# Patient Record
Sex: Male | Born: 1965 | Hispanic: Yes | Marital: Married | Smoking: Never smoker
Health system: Southern US, Community
[De-identification: ages and names within clinical notes are randomized; demographics above are authoritative.]

## PROBLEM LIST (undated history)

## (undated) DIAGNOSIS — R161 Splenomegaly, not elsewhere classified: Secondary | ICD-10-CM

## (undated) DIAGNOSIS — D696 Thrombocytopenia, unspecified: Secondary | ICD-10-CM

## (undated) HISTORY — DX: Splenomegaly, not elsewhere classified: R16.1

## (undated) HISTORY — DX: Thrombocytopenia, unspecified: D69.6

---

## 2009-04-16 ENCOUNTER — Emergency Department: Payer: Self-pay | Admitting: Emergency Medicine

## 2010-01-31 ENCOUNTER — Ambulatory Visit: Payer: Self-pay | Admitting: Gastroenterology

## 2010-03-22 ENCOUNTER — Ambulatory Visit: Payer: Self-pay | Admitting: Gastroenterology

## 2013-05-10 ENCOUNTER — Emergency Department: Payer: Self-pay | Admitting: Emergency Medicine

## 2018-10-01 ENCOUNTER — Ambulatory Visit: Payer: Self-pay

## 2018-10-01 ENCOUNTER — Other Ambulatory Visit: Payer: Self-pay

## 2018-10-01 VITALS — BP 140/96 | HR 69 | Temp 97.1°F | Resp 15 | Ht 64.0 in | Wt 187.0 lb

## 2018-10-01 DIAGNOSIS — Z23 Encounter for immunization: Secondary | ICD-10-CM

## 2018-10-01 DIAGNOSIS — Z008 Encounter for other general examination: Secondary | ICD-10-CM

## 2018-10-01 LAB — POCT LIPID PANEL
Glucose Fasting, POC: 124 mg/dL — AB (ref 70–99)
HDL: 36
LDL: 87
Non-HDL: 128
TC/HDL: 4.6
TC: 164
TRG: 206

## 2018-10-01 NOTE — Patient Instructions (Signed)
Prevencin de la exposicin a lquidos corporales Preventing Body Fluid Exposure La sangre o los lquidos corporales, como la [place], las Tooeleheces, el semen, las secreciones vaginales, la saliva o la leche materna pueden contener grmenes (bacterias o virus) que pueden causar infecciones. La mejor manera de evitar la infeccin por estos grmenes es prevenir la exposicin a los lquidos corporales. Cmo puede afectarme la exposicin a los lquidos corporales? Los grmenes se pueden propagar cuando los lquidos del cuerpo de una persona infectada entran en contacto con la boca, la nariz, los ojos, los genitales o las lesiones de la piel de Engineer, maintenance (IT)otra persona. Qu puede aumentar el riesgo? Usted tiene una mayor probabilidad de quedar expuesto a lquidos corporales infectados si:  Es un trabajador sanitario o un miembro de la familia que est cuidando a una persona enferma.  [place] agujas para inyectarse drogas y comparte agujas con otros consumidores.  Tiene relaciones sexuales o participa en otras actividades sexuales sin usar un condn u otra proteccin. Qu medidas puedo tomar para prevenir la exposicin a los lquidos corporales?   Lave y desinfecte las mesadas y otras superficies regularmente.  Use un equipo protector adecuado, como guantes, batas, mscaras o antiparras cuando haya riesgo de exposicin.  Limpie todos los restos de lquidos corporales con toallas desechables y limpie la zona con un desinfectante.  Deseche correctamente todos los derivados de la sangre y otros lquidos. Use bolsas de seguridad.  Evite volver a tapar las agujas.  Deseche adecuadamente las agujas y otros instrumentos que tengan puntas o filos (objetos afilados). Use recipientes cerrados que estn marcados para objetos punzantes.  Evite el uso de drogas inyectables.  No comparta agujas.  Use preservativos durante las relaciones sexuales.  Use pequeas lminas de plstico (diques dentales) para cubrir la boca, la  vagina o el ano a fin de reducir el riesgo de contraer el VIH u otras infecciones de transmisin sexual durante el sexo oral.  Conozca y siga todas las pautas para prevenir la exposicin (precauciones universales) proporcionadas en su lugar de [place]rabajo. Qu medidas puedo tomar para reducir mis probabilidades de contraer una infeccin?   Lvese las manos frecuentemente con agua y [place]. Use desinfectante para manos si no dispone de [place] y [place].  Asegrese de que sus vacunas estn actualizadas, incluyendo las vacunas contra el ttanos y la hepatitis.  Evite tener mltiples parejas sexuales.  Considere la profilaxis de preexposicin (pre-exposure prophylaxis, PrEP) para el VIH si: ? Tiene una relacin continua con Neomia Dearuna pareja sexual que es VIH positivo. ? Tiene mltiples parejas sexuales y participa en relaciones sexuales sin proteccin. ? Tiene relaciones sexuales con parejas sexuales de alto riesgo.  Considere la profilaxis postexposicin para el VIH con medicamentos (antirretrovirales) despus de Child psychotherapisttener relaciones sexuales sin proteccin. Para que sea ms eficaz, esto se debe comenzar dentro de las 72 horas despus de una posible exposicin. Qu medidas puedo tomar para evitar la propagacin de la infeccin a Economistotras personas?  Mantenga las heridas abiertas cubiertas.  Deseche todos los elementos que contengan sangre colocndolos en la basura. Esto incluye afeitadoras, tampones y vendajes.  No comparta elementos de higiene personal como cepillos de dientes, afeitadoras o hilo dental.  No comparta elementos relacionados con las drogas con Economistotras personas. Estos incluyen agujas, jeringas, sorbetes y pipas.  Siga todas las indicaciones de su mdico para prevenir el contagio de la infeccin. Dnde buscar ms informacin [employer] Millsational Institute for Forensic scientistccupational Safety and Health Biomedical scientist(NIOSH) (Instituto [place]Nacional de Seguridad y Medical LakeSalud Ocupacional): LegalWarrants.glwww.cdc.gov/niosh Resumen  La  mejor manera de evitar la  infeccin por estos grmenes propagada a travs de la sangre o lquidos corporales es prevenir la exposicin a los lquidos corporales.  Use un equipo protector adecuado, como guantes, batas, mscaras o antiparras cuando haya riesgo de exposicin.  Lvese las manos frecuentemente con agua y [place]. Use desinfectante para manos si no dispone de [place] y [place].  Siga todas las indicaciones de su mdico para prevenir el contagio de la infeccin. Esta informacin no tiene Theme park manager el consejo del mdico. Asegrese de hacerle al mdico cualquier pregunta que tenga. Document Released: 03/04/2018 Document Revised: 03/04/2018 Document Reviewed: 03/04/2018 Elsevier Patient Education  2020 Elsevier Inc. [place] antigripal (inactivada o recombinante): lo que debe saber Influenza (Flu) Vaccine (Inactivated or Recombinant): What You Need to Know 1. Por qu vacunarse? La vacuna contra la gripe puede prevenir la gripe. La gripe es una enfermedad contagiosa que se disemina en los [place] cada ao, por lo general, Eusebio Me octubre y [place]. Cualquier persona puede contraer gripe, pero es ms peligrosa para Runner, broadcasting/film/video. Los bebs y los nios pequeos, los [employer] de 65aos, las [place], as [employer] personas que tienen ciertas enfermedades o cuyo sistema inmunitario est debilitado corren un riesgo mayor de tener complicaciones debido a la gripe. La neumona, la bronquitis, las infecciones de los senos paranasales y las infecciones de odos son ejemplos de complicaciones relacionadas con la gripe. Si tiene una afeccin, por ejemplo, enfermedad cardaca, cncer o diabetes, la gripe puede empeorarla. La gripe puede causar fiebre y escalofros, dolor de garganta, dolores musculares, fatiga, tos, dolor de [place] y secrecin o congestin nasal. Algunas personas pueden tener vmitos y Barnett Hatter, [place] esto es ms frecuente en los nios que en los adultos. Cada ao, miles de [employer] Estados  Unidos debido a la gripe, y muchas ms deben ser hospitalizadas. Cada ao, la vacuna antigripal previene millones de enfermedades y evita visitas al mdico relacionadas con la gripe. 2. Madilyn Fireman contra la gripe Los CDC (Centros para el Control y la Prevencin de [place]) recomiendan que todas las personas a Glass blower/designer de los 6 meses de edad se vacunen cada temporada de gripe. Es posible que los nios de a 8aos deban recibir 2 dosis durante la misma temporada de gripe. Todas las dems personas tienen que aplicarse 1 sola dosis cada temporada de gripe. La vacuna comienza a surtir Librarian, academic 2semanas despus de su aplicacin. Hay muchos virus de la gripe, y Estate agent. Cada ao, se elabora una nueva vacuna antigripal para brindar proteccin contra tres o cuatro virus que probablemente causen la enfermedad en la siguiente temporada de gripe. Incluso si la vacuna no es especfica para esos virus, aun as puede brindar cierta proteccin. La vacuna contra la gripe no causa gripe. La vacuna contra la gripe puede administrarse al mismo tiempo que otras vacunas. 3. Hable con el mdico Comunquese con la persona que le coloca las vacunas si la persona que la recibe:  Ha tenido una reaccin alrgica despus de Neomia Dear dosis previa de la vacuna contra la gripe o tiene alguna alergia grave, potencialmente mortal.  Alguna vez tuvo sndrome de Guillain-Barr (tambin llamado SGB). En algunos casos, es posible que el mdico decida posponer la aplicacin de la vacuna contra la gripe para una visita en el futuro. Las personas que sufren trastornos menores, como un resfro, pueden vacunarse. Las personas que tienen enfermedades moderadas o graves generalmente deben esperar hasta recuperarse para poder recibir la vacuna contra la  gripe. Su mdico puede darle ms informacin. 4. Riesgos de [place] reaccin a la vacuna  Despus de recibir la vacuna contra la gripe, Automotive engineer,  enrojecimiento e Estate agent de la inyeccin, fiebre, dolores musculares y Social research officer, government de [place].  Puede haber un pequeo aumento del riesgo de sufrir sndrome de Curator (SGB) despus de la aplicacin de la vacuna contra la gripe inactivada. Los nios pequeos que reciben la vacuna antigripal junto con la vacuna antineumoccica (PCV13), o la DTaP en el mismo momento pueden tener una probabilidad un poco ms elevada de tener una convulsin debido a la fiebre. Informe al mdico si un nio que est recibiendo la vacuna antigripal ha tenido una convulsin alguna vez. Las personas a veces se desmayan despus de procedimientos mdicos, incluida la vacunacin. Informe al mdico si se siente mareado, tiene cambios en la visin o zumbidos en los odos. Al igual que con cualquier [employer], existe una probabilidad muy remota de que una vacuna cause una reaccin alrgica grave, otra lesin grave o la muerte. 5. Qu pasa si se presenta un problema grave? Podra producirse una reaccin alrgica despus de que la persona vacunada abandone la clnica. Si observa signos de Nurse, mental health grave (ronchas, hinchazn de la cara y la garganta, dificultad para respirar, latidos cardacos acelerados, mareos o debilidad), llame al 9-1-1 y lleve a la persona al hospital ms cercano. Si se presentan otros signos que le preocupan, comunquese con su mdico. Las reacciones adversas deben informarse al Sistema de Informe de Eventos Adversos de Clinical biochemist (Vaccine Adverse Event Reporting System, VAERS). Por lo general, el mdico presenta este informe o puede hacerlo usted mismo. Visite el sitio web del VAERS en [email or web address] o llame al [phone].El VAERS es solo para Electrical engineer; su personal no proporciona asesoramiento mdico. 6. Programa Nacional de Compensacin de Daos por Sanderson de Compensacin de Daos por Clinical biochemist (National Vaccine Injury Fiserv, Runner, broadcasting/film/video) es un  programa federal que fue creado para Patent examiner a las personas que puedan haber sufrido daos al recibir ciertas vacunas. Visite el sitio web del VICP en [email or web address] o llame al 1-316-590-6093 para obtener ms informacin acerca del programa y de cmo presentar un reclamo. Hay un lmite de tiempo para presentar un reclamo de compensacin. 7. Cmo puedo obtener ms informacin?  Pregntele al mdico.  Comunquese con el servicio de salud de su localidad o su estado.  Comunquese con los Centros para el Control y la Prevencin de Probation officer for Disease Control and Prevention, CDC): ? Llame al [phone] (1-800-CDC-INFO) o ? Visite el sitio Biomedical engineer en https://gibson.com/ Declaracin de informacin (provisional) sobre la vacuna contra la gripe inactivada (15/08/2017) Esta informacin no tiene Marine scientist el consejo del mdico. Asegrese de hacerle al mdico cualquier pregunta que tenga. Document Released: 03/22/2008 Document Revised: 09/02/2017 Document Reviewed: 09/02/2017 Elsevier Patient Education  2020 [employer].

## 2018-10-01 NOTE — Progress Notes (Signed)
     Patient ID: Javier Rice, male    DOB: [birth year], 53 y.o.   MRN: [id number]    Thank you!!  Apolonio Schneiders RN  [facility] Nurse Specialist [place]: [phone]  Cell:  [phone] Website: [email or web address]

## 2019-03-23 ENCOUNTER — Other Ambulatory Visit: Payer: Self-pay

## 2019-03-23 ENCOUNTER — Emergency Department: Payer: BC Managed Care – PPO

## 2019-03-23 ENCOUNTER — Emergency Department
Admission: EM | Admit: 2019-03-23 | Discharge: 2019-03-23 | Disposition: A | Discharge status: Home / Self Care | Payer: BC Managed Care – PPO | Attending: Emergency Medicine | Admitting: Emergency Medicine

## 2019-03-23 DIAGNOSIS — M25512 Pain in left shoulder: Secondary | ICD-10-CM | POA: Insufficient documentation

## 2019-03-23 DIAGNOSIS — Y9389 Activity, other specified: Secondary | ICD-10-CM | POA: Insufficient documentation

## 2019-03-23 DIAGNOSIS — M899 Disorder of bone, unspecified: Secondary | ICD-10-CM | POA: Diagnosis not present

## 2019-03-23 DIAGNOSIS — D7389 Other diseases of spleen: Secondary | ICD-10-CM | POA: Insufficient documentation

## 2019-03-23 DIAGNOSIS — S0990XA Unspecified injury of head, initial encounter: Secondary | ICD-10-CM | POA: Diagnosis present

## 2019-03-23 DIAGNOSIS — M542 Cervicalgia: Secondary | ICD-10-CM | POA: Diagnosis not present

## 2019-03-23 DIAGNOSIS — Y998 Other external cause status: Secondary | ICD-10-CM | POA: Diagnosis not present

## 2019-03-23 DIAGNOSIS — Y92410 Unspecified street and highway as the place of occurrence of the external cause: Secondary | ICD-10-CM | POA: Diagnosis not present

## 2019-03-23 LAB — COMPREHENSIVE METABOLIC PANEL WITH GFR
ALT: 24 U/L (ref 0–44)
AST: 27 U/L (ref 15–41)
Albumin: 4.4 g/dL (ref 3.5–5.0)
Alkaline Phosphatase: 90 U/L (ref 38–126)
Anion gap: 10 (ref 5–15)
BUN: 18 mg/dL (ref 6–20)
CO2: 23 mmol/L (ref 22–32)
Calcium: 9 mg/dL (ref 8.9–10.3)
Chloride: 107 mmol/L (ref 98–111)
Creatinine, Ser: 0.73 mg/dL (ref 0.61–1.24)
GFR calc Af Amer: >60 mL/min
GFR calc non Af Amer: >60 mL/min
Glucose, Bld: 146 mg/dL — ABNORMAL HIGH (ref 70–99)
Potassium: 3.8 mmol/L (ref 3.5–5.1)
Sodium: 140 mmol/L (ref 135–145)
Total Bilirubin: 1 mg/dL (ref 0.3–1.2)
Total Protein: 7.4 g/dL (ref 6.5–8.1)

## 2019-03-23 LAB — CBC WITH DIFFERENTIAL/PLATELET
Abs Immature Granulocytes: 0.03 10*3/uL (ref 0.00–0.07)
Basophils Absolute: 0 10*3/uL (ref 0.0–0.1)
Basophils Relative: 0 %
Eosinophils Absolute: 0.3 10*3/uL (ref 0.0–0.5)
Eosinophils Relative: 3 %
HCT: 43.8 % (ref 39.0–52.0)
Hemoglobin: 15.8 g/dL (ref 13.0–17.0)
Immature Granulocytes: 0 %
Lymphocytes Relative: 22 %
Lymphs Abs: 2 10*3/uL (ref 0.7–4.0)
MCH: 30.4 pg (ref 26.0–34.0)
MCHC: 36.1 g/dL — ABNORMAL HIGH (ref 30.0–36.0)
MCV: 84.2 fL (ref 80.0–100.0)
Monocytes Absolute: 0.7 10*3/uL (ref 0.1–1.0)
Monocytes Relative: 7 %
Neutro Abs: 6.1 10*3/uL (ref 1.7–7.7)
Neutrophils Relative %: 68 %
Platelets: 149 10*3/uL — ABNORMAL LOW (ref 150–400)
RBC: 5.2 MIL/uL (ref 4.22–5.81)
RDW: 13.3 % (ref 11.5–15.5)
WBC: 9.1 10*3/uL (ref 4.0–10.5)
nRBC: 0 % (ref 0.0–0.2)

## 2019-03-23 MED ORDER — IBUPROFEN 600 MG PO TABS: 600.0000 mg | ORAL_TABLET | Freq: Once | ORAL | Status: DC

## 2019-03-23 MED ORDER — ACETAMINOPHEN 325 MG PO TABS
650.0000 mg | ORAL_TABLET | Freq: Once | ORAL | Status: AC
  Administered 2019-03-23: 650 mg via ORAL
  Filled 2019-03-23: qty 2

## 2019-03-23 MED ORDER — IOHEXOL 300 MG/ML  SOLN
100.0000 mL | Freq: Once | INTRAMUSCULAR | Status: AC | PRN
  Administered 2019-03-23: 100 mL via INTRAVENOUS
  Filled 2019-03-23: qty 100
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
## 2019-03-23 NOTE — Discharge Instructions (Signed)
There are some concerning masses on your spine and in your spleen on your CT scans.  Please follow-up with Dr. Cathie Hoops with oncology this week.  You can call her office tomorrow if you have not heard from them tomorrow morning.  Please stay out of work this week until you have a follow-up appointment with her.

## 2019-03-25 ENCOUNTER — Encounter: Payer: Self-pay | Admitting: Oncology

## 2019-03-25 ENCOUNTER — Inpatient Hospital Stay: Payer: BC Managed Care – PPO | Attending: Oncology | Admitting: Oncology

## 2019-03-25 ENCOUNTER — Other Ambulatory Visit: Payer: Self-pay

## 2019-03-25 ENCOUNTER — Inpatient Hospital Stay: Payer: BC Managed Care – PPO

## 2019-03-25 VITALS — BP 119/83 | HR 62 | Temp 97.4°F | Resp 18 | Wt 195.5 lb

## 2019-03-25 DIAGNOSIS — D7389 Other diseases of spleen: Secondary | ICD-10-CM

## 2019-03-25 DIAGNOSIS — M899 Disorder of bone, unspecified: Secondary | ICD-10-CM | POA: Diagnosis present

## 2019-03-25 DIAGNOSIS — S0990XA Unspecified injury of head, initial encounter: Secondary | ICD-10-CM | POA: Diagnosis not present

## 2019-03-25 DIAGNOSIS — M954 Acquired deformity of chest and rib: Secondary | ICD-10-CM | POA: Diagnosis not present

## 2019-03-25 DIAGNOSIS — R0781 Pleurodynia: Secondary | ICD-10-CM | POA: Diagnosis not present

## 2019-03-25 DIAGNOSIS — R519 Headache, unspecified: Secondary | ICD-10-CM

## 2019-03-25 DIAGNOSIS — F419 Anxiety disorder, unspecified: Secondary | ICD-10-CM | POA: Diagnosis not present

## 2019-03-25 DIAGNOSIS — F329 Major depressive disorder, single episode, unspecified: Secondary | ICD-10-CM | POA: Insufficient documentation

## 2019-03-25 DIAGNOSIS — M542 Cervicalgia: Secondary | ICD-10-CM | POA: Insufficient documentation

## 2019-03-25 DIAGNOSIS — I898 Other specified noninfective disorders of lymphatic vessels and lymph nodes: Secondary | ICD-10-CM | POA: Insufficient documentation

## 2019-03-25 DIAGNOSIS — Y92410 Unspecified street and highway as the place of occurrence of the external cause: Secondary | ICD-10-CM | POA: Diagnosis not present

## 2019-03-25 LAB — CBC WITH DIFFERENTIAL/PLATELET
Abs Immature Granulocytes: 0.02 10*3/uL (ref 0.00–0.07)
Basophils Absolute: 0 10*3/uL (ref 0.0–0.1)
Basophils Relative: 0 %
Eosinophils Absolute: 0.1 10*3/uL (ref 0.0–0.5)
Eosinophils Relative: 1 %
HCT: 42 % (ref 39.0–52.0)
Hemoglobin: 14.8 g/dL (ref 13.0–17.0)
Immature Granulocytes: 0 %
Lymphocytes Relative: 17 %
Lymphs Abs: 1.4 10*3/uL (ref 0.7–4.0)
MCH: 29.8 pg (ref 26.0–34.0)
MCHC: 35.2 g/dL (ref 30.0–36.0)
MCV: 84.7 fL (ref 80.0–100.0)
Monocytes Absolute: 0.5 10*3/uL (ref 0.1–1.0)
Monocytes Relative: 6 %
Neutro Abs: 5.9 10*3/uL (ref 1.7–7.7)
Neutrophils Relative %: 76 %
Platelets: 127 10*3/uL — ABNORMAL LOW (ref 150–400)
RBC: 4.96 MIL/uL (ref 4.22–5.81)
RDW: 13.5 % (ref 11.5–15.5)
WBC: 7.9 10*3/uL (ref 4.0–10.5)
nRBC: 0 % (ref 0.0–0.2)

## 2019-03-25 LAB — HEPATITIS PANEL, ACUTE
HCV Ab: NONREACTIVE
Hep A IgM: NONREACTIVE
Hep B C IgM: NONREACTIVE
Hepatitis B Surface Ag: NONREACTIVE

## 2019-03-25 LAB — HIV ANTIBODY (ROUTINE TESTING W REFLEX): HIV Screen 4th Generation wRfx: NONREACTIVE

## 2019-03-25 LAB — LACTATE DEHYDROGENASE: LDH: 131 U/L (ref 98–192)

## 2019-03-25 LAB — TECHNOLOGIST SMEAR REVIEW

## 2019-03-26 LAB — KAPPA/LAMBDA LIGHT CHAINS
Kappa free light chain: 13.8 mg/L (ref 3.3–19.4)
Kappa, lambda light chain ratio: 0.87 (ref 0.26–1.65)
Lambda free light chains: 15.9 mg/L (ref 5.7–26.3)
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
## 2019-03-29 LAB — MULTIPLE MYELOMA PANEL, SERUM
Albumin SerPl Elph-Mcnc: 3.7 g/dL (ref 2.9–4.4)
Albumin/Glob SerPl: 1.3 (ref 0.7–1.7)
Alpha 1: 0.2 g/dL (ref 0.0–0.4)
Alpha2 Glob SerPl Elph-Mcnc: 0.6 g/dL (ref 0.4–1.0)
B-Globulin SerPl Elph-Mcnc: 1.2 g/dL (ref 0.7–1.3)
Gamma Glob SerPl Elph-Mcnc: 0.9 g/dL (ref 0.4–1.8)
Globulin, Total: 3 g/dL (ref 2.2–3.9)
IgA: 339 mg/dL (ref 90–386)
IgG (Immunoglobin G), Serum: 946 mg/dL (ref 603–1613)
IgM (Immunoglobulin M), Srm: 78 mg/dL (ref 20–172)
Total Protein ELP: 6.7 g/dL (ref 6.0–8.5)

## 2019-03-29 LAB — COMP PANEL: LEUKEMIA/LYMPHOMA

## 2019-04-06 ENCOUNTER — Telehealth: Payer: Self-pay

## 2019-04-06 NOTE — Telephone Encounter (Signed)
PET scan was denied.  MD would like to initiate an appeal.  Left a message with Dr. Bethanne Ginger cell number on the appeal request line.

## 2019-04-07 NOTE — Telephone Encounter (Signed)
Javier Rice has started an appeals request.

## 2019-04-12 ENCOUNTER — Other Ambulatory Visit: Payer: Self-pay

## 2019-04-12 DIAGNOSIS — D7389 Other diseases of spleen: Secondary | ICD-10-CM

## 2019-04-12 DIAGNOSIS — M899 Disorder of bone, unspecified: Secondary | ICD-10-CM

## 2019-04-12 NOTE — Telephone Encounter (Signed)
Patient notified of PET appt. MD appt changed to afternoon due to pt's work schedule. Per pt, his job is asking until when he will continue light duty. Letter provided and will be mailed along with appt reminders.

## 2019-04-12 NOTE — Telephone Encounter (Signed)
Done.Marland Kitchen  PET scan is sched. For 04/20/19 8:30 (must arrive prior) @ [facility]-PET CT1 (do not eat or drink 6 hours prior to exam--except for water)  Pt will RTC 2 days after PETon 04/22/19 @ 930am to see Dr. Cathie Hoops.

## 2019-04-12 NOTE — Telephone Encounter (Signed)
PET approved Auth# [id number] valid 04/12/19-10/08/19 Schedule PET see MD a few days later.  Javier Rice will contact pt will appt detail.

## 2019-04-20 ENCOUNTER — Ambulatory Visit
Admission: RE | Admit: 2019-04-20 | Discharge: 2019-04-20 | Disposition: A | Discharge status: Home / Self Care | Payer: BC Managed Care – PPO | Source: Ambulatory Visit | Attending: Oncology | Admitting: Oncology

## 2019-04-20 ENCOUNTER — Other Ambulatory Visit: Payer: Self-pay

## 2019-04-20 DIAGNOSIS — M899 Disorder of bone, unspecified: Secondary | ICD-10-CM | POA: Diagnosis present

## 2019-04-20 DIAGNOSIS — D7389 Other diseases of spleen: Secondary | ICD-10-CM | POA: Diagnosis not present

## 2019-04-20 LAB — GLUCOSE, CAPILLARY: Glucose-Capillary: 86 mg/dL (ref 70–99)

## 2019-04-20 MED ORDER — FLUDEOXYGLUCOSE F - 18 (FDG) INJECTION
10.6800 | Freq: Once | INTRAVENOUS | Status: AC | PRN
  Administered 2019-04-20: 10.68 via INTRAVENOUS

## 2019-04-22 ENCOUNTER — Ambulatory Visit: Payer: BC Managed Care – PPO | Admitting: Oncology

## 2019-04-23 ENCOUNTER — Other Ambulatory Visit: Payer: Self-pay

## 2019-04-23 ENCOUNTER — Other Ambulatory Visit: Payer: Self-pay | Admitting: Oncology

## 2019-04-23 ENCOUNTER — Encounter: Payer: Self-pay | Admitting: Oncology

## 2019-04-23 ENCOUNTER — Inpatient Hospital Stay: Payer: BC Managed Care – PPO | Attending: Oncology | Admitting: Oncology

## 2019-04-23 VITALS — BP 136/87 | HR 64 | Temp 97.9°F | Resp 16 | Wt 190.9 lb

## 2019-04-23 DIAGNOSIS — D7389 Other diseases of spleen: Secondary | ICD-10-CM | POA: Insufficient documentation

## 2019-04-23 DIAGNOSIS — K5730 Diverticulosis of large intestine without perforation or abscess without bleeding: Secondary | ICD-10-CM | POA: Insufficient documentation

## 2019-04-23 DIAGNOSIS — F329 Major depressive disorder, single episode, unspecified: Secondary | ICD-10-CM | POA: Insufficient documentation

## 2019-04-23 DIAGNOSIS — Z596 Low income: Secondary | ICD-10-CM | POA: Insufficient documentation

## 2019-04-23 DIAGNOSIS — I898 Other specified noninfective disorders of lymphatic vessels and lymph nodes: Secondary | ICD-10-CM | POA: Diagnosis not present

## 2019-04-23 DIAGNOSIS — M899 Disorder of bone, unspecified: Secondary | ICD-10-CM | POA: Insufficient documentation

## 2019-04-23 DIAGNOSIS — Z79899 Other long term (current) drug therapy: Secondary | ICD-10-CM | POA: Insufficient documentation
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
## 2019-04-23 NOTE — Progress Notes (Signed)
Pt in for follow up and test results.  Pt daughter is with him and wife is on phone for visit.

## 2019-05-06 ENCOUNTER — Other Ambulatory Visit: Payer: Self-pay

## 2019-05-06 ENCOUNTER — Ambulatory Visit
Admission: RE | Admit: 2019-05-06 | Discharge: 2019-05-06 | Disposition: A | Discharge status: Home / Self Care | Payer: BC Managed Care – PPO | Source: Ambulatory Visit | Attending: Oncology | Admitting: Oncology

## 2019-05-06 DIAGNOSIS — M899 Disorder of bone, unspecified: Secondary | ICD-10-CM | POA: Diagnosis present

## 2019-05-06 MED ORDER — GADOBUTROL 1 MMOL/ML IV SOLN
8.0000 mL | Freq: Once | INTRAVENOUS | Status: AC | PRN
  Administered 2019-05-06: 8 mL via INTRAVENOUS

## 2019-05-11 ENCOUNTER — Telehealth: Payer: Self-pay

## 2019-05-11 NOTE — Telephone Encounter (Signed)
Pt's wife, Darlina Rumpf, notified of results.

## 2019-07-23 ENCOUNTER — Other Ambulatory Visit: Payer: Self-pay

## 2019-07-23 ENCOUNTER — Ambulatory Visit
Admission: RE | Admit: 2019-07-23 | Discharge: 2019-07-23 | Disposition: A | Discharge status: Home / Self Care | Payer: BC Managed Care – PPO | Source: Ambulatory Visit | Attending: Oncology | Admitting: Oncology

## 2019-07-23 DIAGNOSIS — D7389 Other diseases of spleen: Secondary | ICD-10-CM | POA: Diagnosis present

## 2019-07-23 MED ORDER — GADOBUTROL 1 MMOL/ML IV SOLN
8.0000 mL | Freq: Once | INTRAVENOUS | Status: AC | PRN
  Administered 2019-07-23: 8 mL via INTRAVENOUS

## 2019-07-27 ENCOUNTER — Other Ambulatory Visit: Payer: BC Managed Care – PPO

## 2019-07-27 ENCOUNTER — Inpatient Hospital Stay: Payer: BC Managed Care – PPO | Attending: Oncology | Admitting: Oncology

## 2019-07-27 ENCOUNTER — Encounter: Payer: Self-pay | Admitting: Oncology

## 2019-07-27 ENCOUNTER — Other Ambulatory Visit: Payer: Self-pay

## 2019-07-27 VITALS — BP 126/79 | HR 55 | Temp 98.1°F | Resp 18 | Wt 197.6 lb

## 2019-07-27 DIAGNOSIS — R161 Splenomegaly, not elsewhere classified: Secondary | ICD-10-CM | POA: Insufficient documentation

## 2019-07-27 DIAGNOSIS — D7389 Other diseases of spleen: Secondary | ICD-10-CM | POA: Diagnosis not present

## 2019-07-27 DIAGNOSIS — Z79899 Other long term (current) drug therapy: Secondary | ICD-10-CM | POA: Diagnosis not present

## 2019-07-27 DIAGNOSIS — F329 Major depressive disorder, single episode, unspecified: Secondary | ICD-10-CM | POA: Insufficient documentation

## 2019-07-27 DIAGNOSIS — M899 Disorder of bone, unspecified: Secondary | ICD-10-CM | POA: Diagnosis present

## 2019-07-27 DIAGNOSIS — I898 Other specified noninfective disorders of lymphatic vessels and lymph nodes: Secondary | ICD-10-CM | POA: Insufficient documentation

## 2019-07-27 NOTE — Progress Notes (Signed)
Patient here for follow up. No new concerns voiced.  °
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
## 2020-01-27 ENCOUNTER — Other Ambulatory Visit: Payer: Self-pay

## 2020-01-27 ENCOUNTER — Ambulatory Visit
Admission: RE | Admit: 2020-01-27 | Discharge: 2020-01-27 | Disposition: A | Discharge status: Home / Self Care | Payer: BC Managed Care – PPO | Source: Ambulatory Visit | Attending: Oncology | Admitting: Oncology

## 2020-01-27 DIAGNOSIS — D7389 Other diseases of spleen: Secondary | ICD-10-CM | POA: Diagnosis present

## 2020-01-27 DIAGNOSIS — M899 Disorder of bone, unspecified: Secondary | ICD-10-CM | POA: Insufficient documentation

## 2020-01-27 MED ORDER — GADOBUTROL 1 MMOL/ML IV SOLN
8.0000 mL | Freq: Once | INTRAVENOUS | Status: AC | PRN
  Administered 2020-01-27: 8 mL via INTRAVENOUS

## 2020-01-31 ENCOUNTER — Inpatient Hospital Stay: Payer: BC Managed Care – PPO

## 2020-01-31 ENCOUNTER — Inpatient Hospital Stay: Payer: BC Managed Care – PPO | Admitting: Oncology

## 2020-02-21 ENCOUNTER — Encounter: Payer: Self-pay | Admitting: Oncology

## 2020-02-21 ENCOUNTER — Inpatient Hospital Stay: Payer: BC Managed Care – PPO | Attending: Oncology

## 2020-02-21 ENCOUNTER — Inpatient Hospital Stay (HOSPITAL_BASED_OUTPATIENT_CLINIC_OR_DEPARTMENT_OTHER): Payer: BC Managed Care – PPO | Admitting: Oncology

## 2020-02-21 VITALS — BP 128/81 | HR 62 | Temp 98.7°F | Resp 16 | Wt 196.0 lb

## 2020-02-21 DIAGNOSIS — I898 Other specified noninfective disorders of lymphatic vessels and lymph nodes: Secondary | ICD-10-CM | POA: Insufficient documentation

## 2020-02-21 DIAGNOSIS — D696 Thrombocytopenia, unspecified: Secondary | ICD-10-CM | POA: Diagnosis not present

## 2020-02-21 DIAGNOSIS — Z79899 Other long term (current) drug therapy: Secondary | ICD-10-CM | POA: Insufficient documentation

## 2020-02-21 DIAGNOSIS — M899 Disorder of bone, unspecified: Secondary | ICD-10-CM | POA: Diagnosis present

## 2020-02-21 DIAGNOSIS — D7389 Other diseases of spleen: Secondary | ICD-10-CM | POA: Insufficient documentation

## 2020-02-21 LAB — COMPREHENSIVE METABOLIC PANEL
ALT: 30 U/L (ref 0–44)
AST: 30 U/L (ref 15–41)
Albumin: 4.2 g/dL (ref 3.5–5.0)
Alkaline Phosphatase: 76 U/L (ref 38–126)
Anion gap: 10 (ref 5–15)
BUN: 14 mg/dL (ref 6–20)
CO2: 23 mmol/L (ref 22–32)
Calcium: 8.9 mg/dL (ref 8.9–10.3)
Chloride: 105 mmol/L (ref 98–111)
Creatinine, Ser: 0.8 mg/dL (ref 0.61–1.24)
GFR, Estimated: >60 mL/min (ref >60)
Glucose, Bld: 134 mg/dL — ABNORMAL HIGH (ref 70–99)
Potassium: 3.9 mmol/L (ref 3.5–5.1)
Sodium: 138 mmol/L (ref 135–145)
Total Bilirubin: 0.9 mg/dL (ref 0.3–1.2)
Total Protein: 7.2 g/dL (ref 6.5–8.1)

## 2020-02-21 LAB — CBC WITH DIFFERENTIAL/PLATELET
Abs Immature Granulocytes: 0.03 10*3/uL (ref 0.00–0.07)
Basophils Absolute: 0 10*3/uL (ref 0.0–0.1)
Basophils Relative: 0 %
Eosinophils Absolute: 0.2 10*3/uL (ref 0.0–0.5)
Eosinophils Relative: 2 %
HCT: 40 % (ref 39.0–52.0)
Hemoglobin: 14.6 g/dL (ref 13.0–17.0)
Immature Granulocytes: 0 %
Lymphocytes Relative: 18 %
Lymphs Abs: 1.6 10*3/uL (ref 0.7–4.0)
MCH: 30.2 pg (ref 26.0–34.0)
MCHC: 36.5 g/dL — ABNORMAL HIGH (ref 30.0–36.0)
MCV: 82.6 fL (ref 80.0–100.0)
Monocytes Absolute: 0.5 10*3/uL (ref 0.1–1.0)
Monocytes Relative: 6 %
Neutro Abs: 6.6 10*3/uL (ref 1.7–7.7)
Neutrophils Relative %: 74 %
Platelets: 120 10*3/uL — ABNORMAL LOW (ref 150–400)
RBC: 4.84 MIL/uL (ref 4.22–5.81)
RDW: 13.5 % (ref 11.5–15.5)
WBC: 9 10*3/uL (ref 4.0–10.5)
nRBC: 0 % (ref 0.0–0.2)
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
## 2020-02-21 NOTE — Progress Notes (Signed)
Patient denies new problems/concerns today.   °

## 2021-02-20 ENCOUNTER — Inpatient Hospital Stay: Payer: BC Managed Care – PPO | Attending: Oncology

## 2021-02-20 ENCOUNTER — Other Ambulatory Visit: Payer: Self-pay

## 2021-02-20 ENCOUNTER — Encounter: Payer: Self-pay | Admitting: Oncology

## 2021-02-20 ENCOUNTER — Inpatient Hospital Stay (HOSPITAL_BASED_OUTPATIENT_CLINIC_OR_DEPARTMENT_OTHER): Payer: BC Managed Care – PPO | Admitting: Oncology

## 2021-02-20 VITALS — BP 133/78 | HR 57 | Temp 97.7°F | Resp 18 | Wt 193.7 lb

## 2021-02-20 DIAGNOSIS — I898 Other specified noninfective disorders of lymphatic vessels and lymph nodes: Secondary | ICD-10-CM | POA: Diagnosis not present

## 2021-02-20 DIAGNOSIS — Z79899 Other long term (current) drug therapy: Secondary | ICD-10-CM | POA: Insufficient documentation

## 2021-02-20 DIAGNOSIS — M899 Disorder of bone, unspecified: Secondary | ICD-10-CM | POA: Diagnosis present

## 2021-02-20 DIAGNOSIS — D7389 Other diseases of spleen: Secondary | ICD-10-CM

## 2021-02-20 DIAGNOSIS — D696 Thrombocytopenia, unspecified: Secondary | ICD-10-CM

## 2021-02-20 LAB — CBC WITH DIFFERENTIAL/PLATELET
Abs Immature Granulocytes: 0.02 10*3/uL (ref 0.00–0.07)
Basophils Absolute: 0 10*3/uL (ref 0.0–0.1)
Basophils Relative: 1 %
Eosinophils Absolute: 0.2 10*3/uL (ref 0.0–0.5)
Eosinophils Relative: 2 %
HCT: 41.9 % (ref 39.0–52.0)
Hemoglobin: 14.9 g/dL (ref 13.0–17.0)
Immature Granulocytes: 0 %
Lymphocytes Relative: 23 %
Lymphs Abs: 1.9 10*3/uL (ref 0.7–4.0)
MCH: 30 pg (ref 26.0–34.0)
MCHC: 35.6 g/dL (ref 30.0–36.0)
MCV: 84.5 fL (ref 80.0–100.0)
Monocytes Absolute: 0.6 10*3/uL (ref 0.1–1.0)
Monocytes Relative: 7 %
Neutro Abs: 5.7 10*3/uL (ref 1.7–7.7)
Neutrophils Relative %: 67 %
Platelets: 118 10*3/uL — ABNORMAL LOW (ref 150–400)
RBC: 4.96 MIL/uL (ref 4.22–5.81)
RDW: 13.3 % (ref 11.5–15.5)
WBC: 8.5 10*3/uL (ref 4.0–10.5)
nRBC: 0 % (ref 0.0–0.2)

## 2021-02-20 LAB — COMPREHENSIVE METABOLIC PANEL
ALT: 24 U/L (ref 0–44)
AST: 28 U/L (ref 15–41)
Albumin: 4.4 g/dL (ref 3.5–5.0)
Alkaline Phosphatase: 67 U/L (ref 38–126)
Anion gap: 6 (ref 5–15)
BUN: 17 mg/dL (ref 6–20)
CO2: 25 mmol/L (ref 22–32)
Calcium: 8.9 mg/dL (ref 8.9–10.3)
Chloride: 104 mmol/L (ref 98–111)
Creatinine, Ser: 0.97 mg/dL (ref 0.61–1.24)
GFR, Estimated: >60 mL/min (ref >60)
Glucose, Bld: 90 mg/dL (ref 70–99)
Potassium: 3.7 mmol/L (ref 3.5–5.1)
Sodium: 135 mmol/L (ref 135–145)
Total Bilirubin: 0.9 mg/dL (ref 0.3–1.2)
Total Protein: 7.2 g/dL (ref 6.5–8.1)

## 2021-02-20 NOTE — Progress Notes (Signed)
Pt here for follow up. No new concerns voiced.   
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
## 2021-02-28 ENCOUNTER — Ambulatory Visit: Payer: BC Managed Care – PPO

## 2021-04-02 ENCOUNTER — Ambulatory Visit: Payer: BC Managed Care – PPO

## 2021-04-17 ENCOUNTER — Ambulatory Visit
Admission: RE | Admit: 2021-04-17 | Discharge: 2021-04-17 | Disposition: A | Discharge status: Home / Self Care | Payer: BC Managed Care – PPO | Source: Ambulatory Visit | Attending: Oncology | Admitting: Oncology

## 2021-04-17 DIAGNOSIS — D7389 Other diseases of spleen: Secondary | ICD-10-CM | POA: Insufficient documentation

## 2021-04-17 MED ORDER — GADOBUTROL 1 MMOL/ML IV SOLN
9.0000 mL | Freq: Once | INTRAVENOUS | Status: AC | PRN
  Administered 2021-04-17: 9 mL via INTRAVENOUS

## 2021-10-08 ENCOUNTER — Telehealth: Payer: Self-pay

## 2021-10-08 DIAGNOSIS — D7389 Other diseases of spleen: Secondary | ICD-10-CM

## 2021-10-08 NOTE — Telephone Encounter (Signed)
-----   Message from Earlie Server, MD sent at 10/07/2021  4:37 PM EDT ----- His MRI in April showed decreased spleen mass. I recommend to continue monitor.  Repeat MRI abdomen wwo in April 2024 and follow up a few days after imaging. Thanks.

## 2021-10-08 NOTE — Telephone Encounter (Signed)
Please schedule MRI abdomen in April and MD only a few days after MRI  I will contact pt with appt details.

## 2021-10-09 NOTE — Telephone Encounter (Signed)
Called pt, no answer and VM not set up. Spoke to daughter Gwen Pounds and informed her of MD recommendation and appts. She states she viewed appts on Mychart and will inform her father.

## 2022-02-25 ENCOUNTER — Other Ambulatory Visit: Payer: Self-pay | Admitting: *Deleted

## 2022-02-25 DIAGNOSIS — D696 Thrombocytopenia, unspecified: Secondary | ICD-10-CM

## 2022-02-26 ENCOUNTER — Inpatient Hospital Stay: Payer: BC Managed Care – PPO | Admitting: Oncology

## 2022-02-26 ENCOUNTER — Inpatient Hospital Stay: Payer: BC Managed Care – PPO | Attending: Oncology

## 2022-02-27 ENCOUNTER — Telehealth: Payer: Self-pay | Admitting: Oncology

## 2022-02-27 NOTE — Telephone Encounter (Signed)
Called pt and lvm to notify of missed appt and req to r/s

## 2022-04-09 ENCOUNTER — Ambulatory Visit: Admission: RE | Admit: 2022-04-09 | Payer: BC Managed Care – PPO | Source: Ambulatory Visit

## 2022-04-13 ENCOUNTER — Ambulatory Visit
Admission: RE | Admit: 2022-04-13 | Discharge: 2022-04-13 | Disposition: A | Discharge status: Home / Self Care | Payer: BC Managed Care – PPO | Source: Ambulatory Visit | Attending: Oncology | Admitting: Oncology

## 2022-04-13 DIAGNOSIS — D7389 Other diseases of spleen: Secondary | ICD-10-CM

## 2022-04-13 MED ORDER — GADOBUTROL 1 MMOL/ML IV SOLN
9.0000 mL | Freq: Once | INTRAVENOUS | Status: AC | PRN
  Administered 2022-04-13: 9 mL via INTRAVENOUS

## 2022-04-15 ENCOUNTER — Inpatient Hospital Stay: Payer: BC Managed Care – PPO | Attending: Oncology

## 2022-04-15 ENCOUNTER — Other Ambulatory Visit: Payer: Self-pay | Admitting: Oncology

## 2022-04-15 ENCOUNTER — Encounter: Payer: Self-pay | Admitting: Oncology

## 2022-04-15 ENCOUNTER — Inpatient Hospital Stay: Payer: BC Managed Care – PPO

## 2022-04-15 ENCOUNTER — Inpatient Hospital Stay: Payer: BC Managed Care – PPO | Admitting: Oncology

## 2022-04-15 ENCOUNTER — Inpatient Hospital Stay (HOSPITAL_BASED_OUTPATIENT_CLINIC_OR_DEPARTMENT_OTHER): Payer: BC Managed Care – PPO | Admitting: Oncology

## 2022-04-15 VITALS — BP 135/77 | HR 60 | Temp 98.4°F | Resp 18 | Wt 197.9 lb

## 2022-04-15 DIAGNOSIS — Z79899 Other long term (current) drug therapy: Secondary | ICD-10-CM | POA: Insufficient documentation

## 2022-04-15 DIAGNOSIS — D696 Thrombocytopenia, unspecified: Secondary | ICD-10-CM

## 2022-04-15 DIAGNOSIS — Z1211 Encounter for screening for malignant neoplasm of colon: Secondary | ICD-10-CM | POA: Insufficient documentation

## 2022-04-15 DIAGNOSIS — D7389 Other diseases of spleen: Secondary | ICD-10-CM | POA: Diagnosis present

## 2022-04-15 LAB — CBC WITH DIFFERENTIAL/PLATELET
Abs Immature Granulocytes: 0.03 10*3/uL (ref 0.00–0.07)
Basophils Absolute: 0.1 10*3/uL (ref 0.0–0.1)
Basophils Relative: 1 %
Eosinophils Absolute: 0.2 10*3/uL (ref 0.0–0.5)
Eosinophils Relative: 3 %
HCT: 41.7 % (ref 39.0–52.0)
Hemoglobin: 14.6 g/dL (ref 13.0–17.0)
Immature Granulocytes: 0 %
Lymphocytes Relative: 29 %
Lymphs Abs: 2.8 10*3/uL (ref 0.7–4.0)
MCH: 29.8 pg (ref 26.0–34.0)
MCHC: 35 g/dL (ref 30.0–36.0)
MCV: 85.1 fL (ref 80.0–100.0)
Monocytes Absolute: 0.6 10*3/uL (ref 0.1–1.0)
Monocytes Relative: 7 %
Neutro Abs: 5.7 10*3/uL (ref 1.7–7.7)
Neutrophils Relative %: 60 %
Platelets: 121 10*3/uL — ABNORMAL LOW (ref 150–400)
RBC: 4.9 MIL/uL (ref 4.22–5.81)
RDW: 13.6 % (ref 11.5–15.5)
WBC: 9.4 10*3/uL (ref 4.0–10.5)
nRBC: 0 % (ref 0.0–0.2)

## 2022-04-15 LAB — CMP (CANCER CENTER ONLY)
ALT: 30 U/L (ref 0–44)
AST: 36 U/L (ref 15–41)
Albumin: 4.2 g/dL (ref 3.5–5.0)
Alkaline Phosphatase: 76 U/L (ref 38–126)
Anion gap: 8 (ref 5–15)
BUN: 11 mg/dL (ref 6–20)
CO2: 22 mmol/L (ref 22–32)
Calcium: 8.7 mg/dL — ABNORMAL LOW (ref 8.9–10.3)
Chloride: 107 mmol/L (ref 98–111)
Creatinine: 0.84 mg/dL (ref 0.61–1.24)
GFR, Estimated: >60 mL/min (ref >60)
Glucose, Bld: 105 mg/dL — ABNORMAL HIGH (ref 70–99)
Potassium: 3.7 mmol/L (ref 3.5–5.1)
Sodium: 137 mmol/L (ref 135–145)
Total Bilirubin: 1.3 mg/dL — ABNORMAL HIGH (ref 0.3–1.2)
Total Protein: 7.1 g/dL (ref 6.5–8.1)
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
## 2022-04-15 NOTE — Assessment & Plan Note (Signed)
probably due to splenomegaly. Counts are slightly lower.  No intervention needed.  Recommend observation. Patient follow-up in 1 year with repeat CBC CMP.

## 2022-04-15 NOTE — Assessment & Plan Note (Signed)
Lesions are not hypermetabolic on PET scan, MRI showed findings compatible with hemangioma. Repeat MRI was read discussed with patient.  Lesions continue to grow slowly in size Will refer patient to interventional radiology for evaluation of feasibility of embolization. I discussed with IR Dr. Juliette Alcide.

## 2022-04-15 NOTE — Assessment & Plan Note (Signed)
Patient is overdue for colonoscopy screening.  Refer to GI

## 2022-04-16 ENCOUNTER — Telehealth: Payer: Self-pay

## 2022-04-16 DIAGNOSIS — D696 Thrombocytopenia, unspecified: Secondary | ICD-10-CM

## 2022-04-16 NOTE — Telephone Encounter (Signed)
Please schedule and communicate appts with pt.   Lab/MD in 1 year (cbc,cmp) Interpreter needed

## 2022-04-16 NOTE — Telephone Encounter (Signed)
-----   Message from Rickard Patience, MD sent at 04/15/2022  7:09 PM EDT ----- Since he will be seen by IR in the near future for embolization, I will recommend him to follow up in 1 year, cbc cmp lab MD  I will hold off repeating MRI of abdomen for now.  Thanks.   zy

## 2022-04-24 ENCOUNTER — Encounter: Payer: Self-pay | Admitting: *Deleted

## 2022-06-29 IMAGING — MR MR ABDOMEN WO/W CM
17 of 18 series · 47 of 48 positions shown · IV contrast (8ml Gadavist)
Comparison: CT abdomen pelvis March 23, 2019, PET-CT April 20, 2019, and MRI abdomen July 23, 2019.

CLINICAL DATA: Follow-up splenic lesion seen on prior MRI and
PET-CT

EXAM:
MRI ABDOMEN WITHOUT AND WITH CONTRAST
TECHNIQUE: Multiplanar multisequence MR imaging of the abdomen was performed
both before and after the administration of intravenous contrast.
CONTRAST:  8mL GADAVIST GADOBUTROL 1 MMOL/ML IV SOLN

[Series 2: T2 · coronal · 6.5mm · 1.19mm/px · 2 of 33 slices shown (1 of 2)]
[im 1/33]
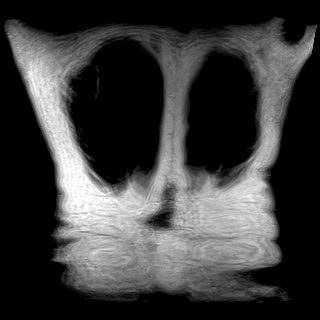
[im 33/33]
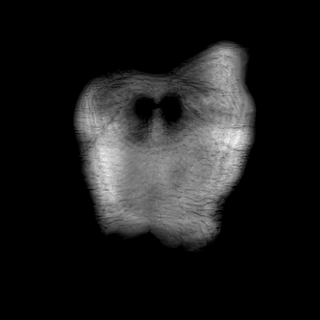

[Series 3: T2 · axial · 6.5mm · 1.25mm/px · z∈[-98,+160]mm · 2 of 34 slices shown (2 of 2)]
[im 1/34]
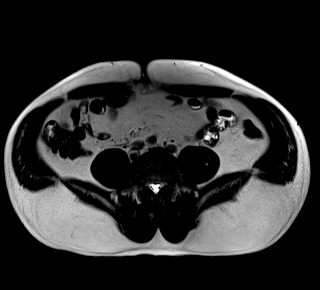
[im 34/34]
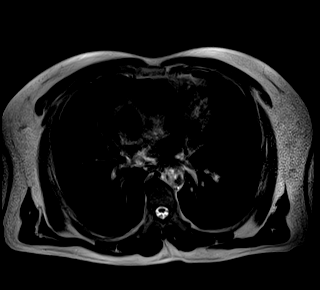

[Series 5: T2 fat-sat · axial · 6.5mm · 1.25mm/px · z∈[-98,+160]mm · 2 of 34 slices shown]
[im 1/34]
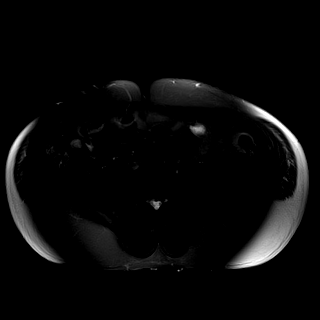
[im 34/34]
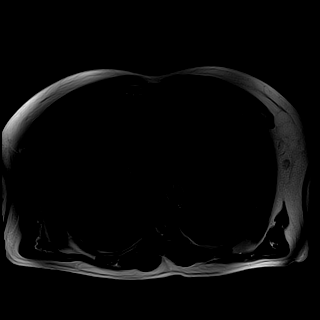

[Series 6: ax dwi_tracew · axial · 6.5mm · 1.49mm/px · z∈[-98,+160]mm · 5 of 102 slices shown]
[im 1/102]
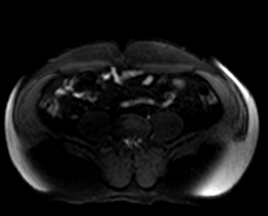
[im 26/102]
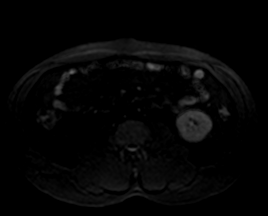
[im 51/102]
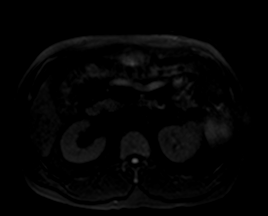
[im 76/102]
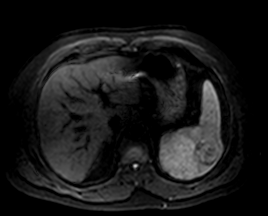
[im 102/102]
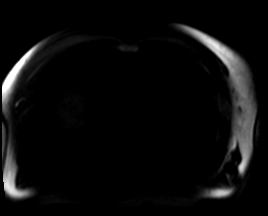

[Series 7: ax dwi_adc · axial · 6.5mm · 1.49mm/px · z∈[-98,+160]mm · 2 of 34 slices shown]
[im 1/34]
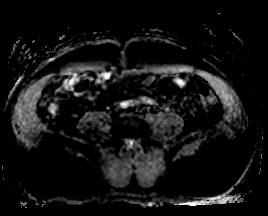
[im 34/34]
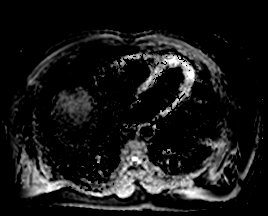

[Series 8: T1 · axial · 6.5mm · 0.78mm/px · z∈[-98,+160]mm · 2 of 34 slices shown]
[im 1/34]
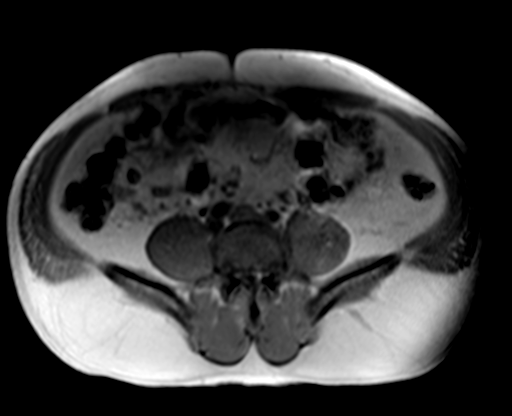
[im 34/34]
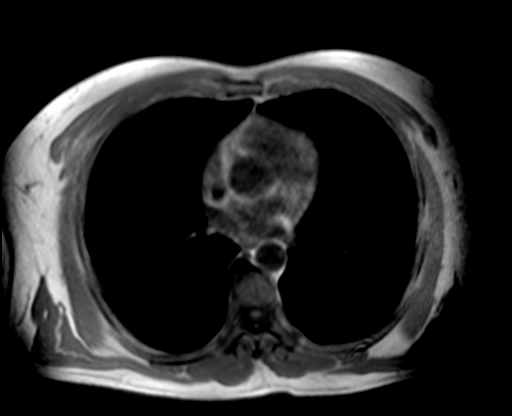

[Series 9: bSSFP · axial · 6.5mm · 0.74mm/px · z∈[-98,+160]mm · 2 of 34 slices shown]
[im 1/34]
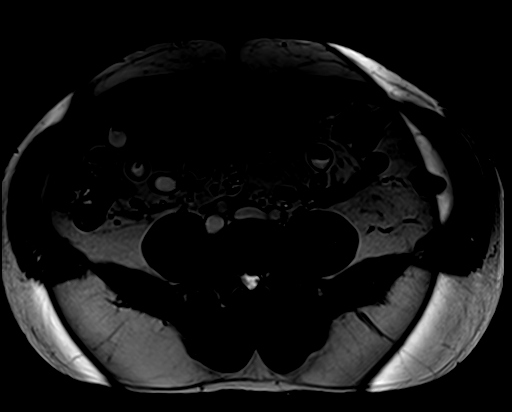
[im 34/34]
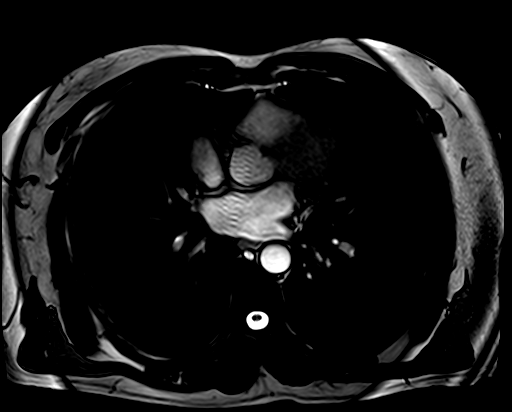

[Series 10: T1 dynamic fat-sat · axial · non-contrast · 3.0mm · 1.25mm/px · z∈[-88,+149]mm · 3 of 80 slices shown (1 of 5)]
[im 1/80]
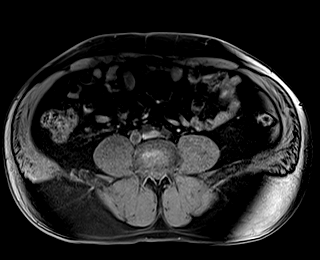
[im 40/80]
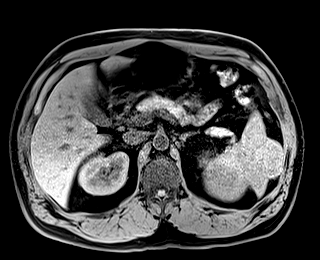
[im 80/80]
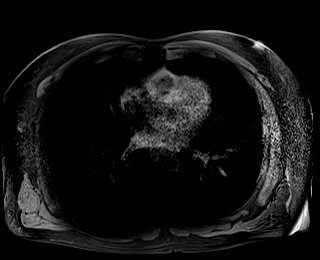

[Series 11: T1 dynamic fat-sat post-contrast · axial · 3.0mm · 1.25mm/px · z∈[-88,+149]mm · 3 of 80 slices shown (1 of 4)]
[im 1/80]
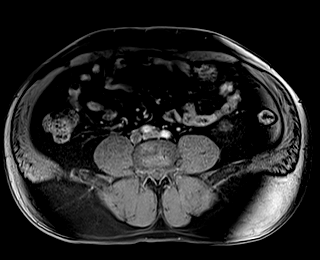
[im 40/80]
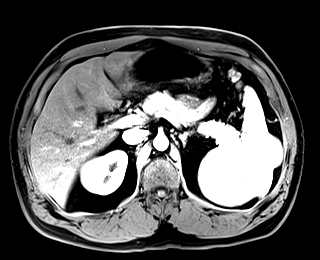
[im 80/80]
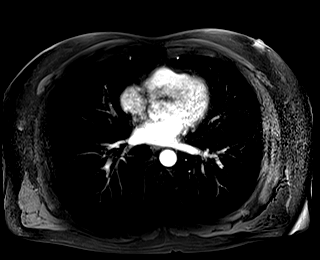

[Series 12: T1 dynamic fat-sat · axial · 3.0mm · 1.25mm/px · z∈[-88,+149]mm · 3 of 80 slices shown (2 of 5)]
[im 1/80]
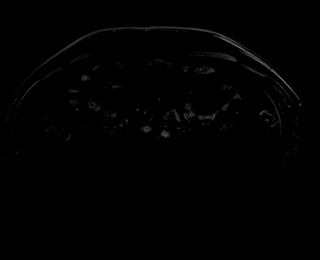
[im 40/80]
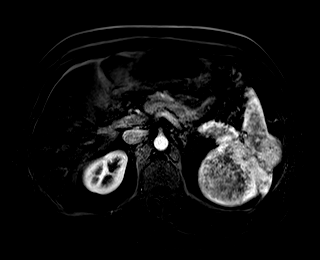
[im 80/80]
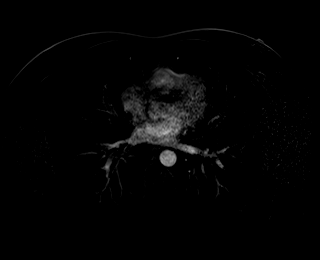

[Series 13: T1 dynamic fat-sat post-contrast · axial · 3.0mm · 1.25mm/px · z∈[-88,+149]mm · 3 of 80 slices shown (2 of 4)]
[im 1/80]
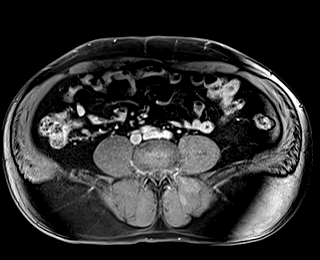
[im 40/80]
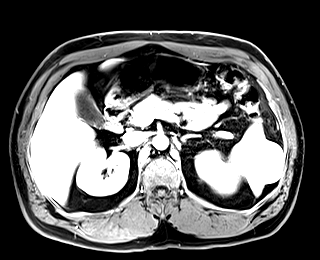
[im 80/80]
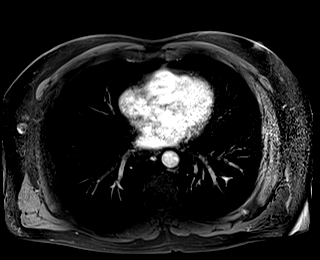

[Series 14: T1 dynamic fat-sat · axial · 3.0mm · 1.25mm/px · z∈[-88,+149]mm · 3 of 80 slices shown (3 of 5)]
[im 1/80]
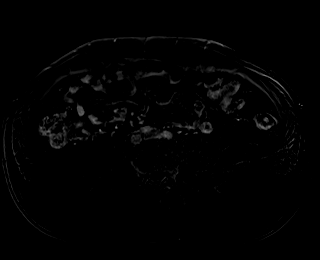
[im 40/80]
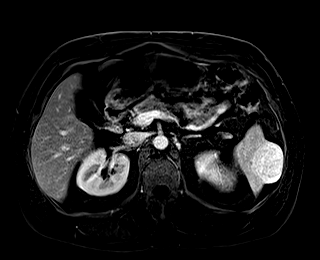
[im 80/80]
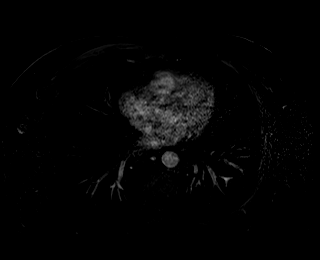

[Series 15: T1 dynamic fat-sat post-contrast · axial · 3.0mm · 1.25mm/px · z∈[-88,+149]mm · 3 of 80 slices shown (3 of 4)]
[im 1/80]
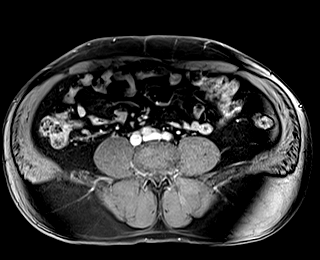
[im 40/80]
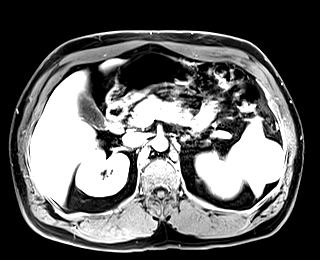
[im 80/80]
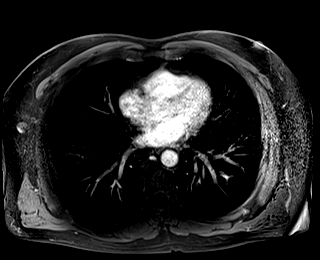

[Series 16: T1 dynamic fat-sat · axial · 3.0mm · 1.25mm/px · z∈[-88,+149]mm · 3 of 80 slices shown (4 of 5)]
[im 1/80]
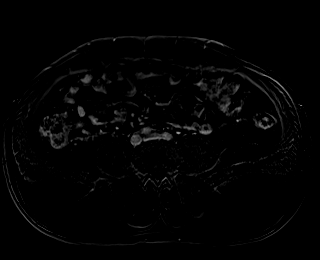
[im 40/80]
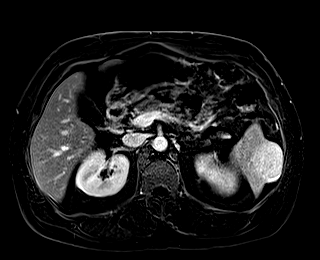
[im 80/80]
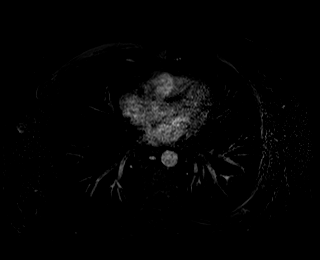

[Series 17: T1 dynamic post-contrast · coronal · 3.0mm · 1.31mm/px · 3 of 80 slices shown]
[im 1/80]
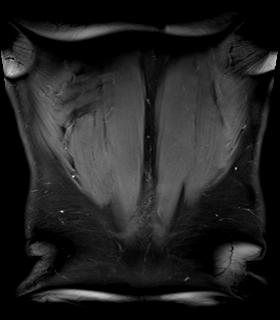
[im 40/80]
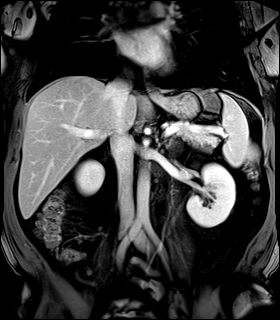
[im 80/80]
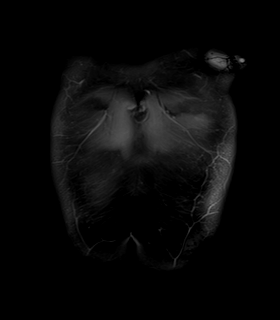

[Series 18: T1 dynamic fat-sat post-contrast · axial · 3.0mm · 1.25mm/px · z∈[-88,+149]mm · 3 of 80 slices shown (4 of 4)]
[im 1/80]
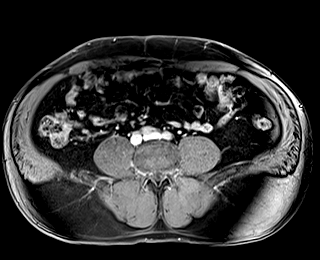
[im 40/80]
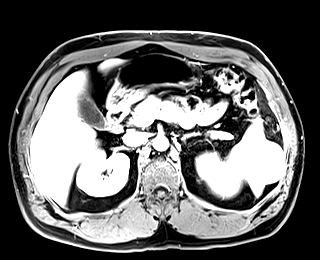
[im 80/80]
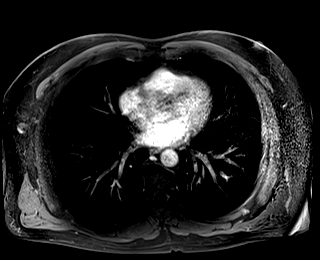

[Series 19: T1 dynamic fat-sat · axial · 3.0mm · 1.25mm/px · z∈[-88,+149]mm · 3 of 80 slices shown (5 of 5)]
[im 1/80]
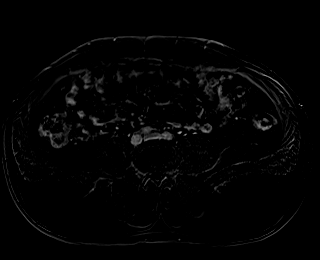
[im 40/80]
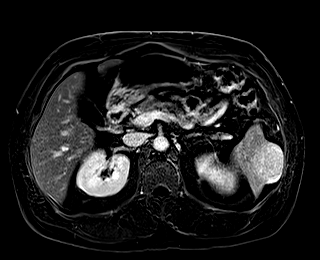
[im 80/80]
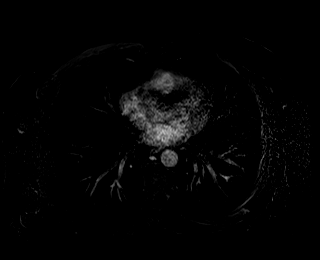

[47 of 48 positions shown; findings below may reference images not displayed]

FINDINGS: Lower chest: No acute findings.

Hepatobiliary: No mass or other parenchymal abnormality identified.

Pancreas: No mass, inflammatory changes, or other parenchymal
abnormality identified.

Spleen: Again seen are several (4) well-circumscribed heterogeneous
splenic masses which are near isointense on T1 with moderate T2
hyperintensity and postcontrast contrast enhancement, these are
unchanged in size with the largest measuring 7.6 x 6.8 cm.

Adrenals/Urinary Tract: No masses identified. No evidence of
hydronephrosis.

Stomach/Bowel: Visualized portions within the abdomen are
unremarkable.

Vascular/Lymphatic: No pathologically enlarged lymph nodes
identified. No abdominal aortic aneurysm demonstrated. Similar size
of the simple cystic structure adjacent to the LEFT renal vein which
measures 1.6 cm previously 1.4 cm, likely a benign lymphocele
(series 3, image 21).

Other:  None.

Musculoskeletal: No suspicious bone lesions identified.
IMPRESSION: Multiple well-circumscribed splenic masses which are unchanged in
size with the largest measuring 7.6 x 6.8 cm. These are most
consistent with benign hemangiomas, and given the imaging
characteristics and stability these require no additional imaging
follow-up. This recommendation follows ACR consensus guidelines:
White Paper of the ACR Incidental Findings Committee II on Splenic
and Nodal Findings. [HOSPITAL] 9182;[DATE].

## 2022-12-13 ENCOUNTER — Encounter: Payer: Self-pay | Admitting: Emergency Medicine

## 2022-12-13 ENCOUNTER — Other Ambulatory Visit: Payer: Self-pay

## 2022-12-13 DIAGNOSIS — R109 Unspecified abdominal pain: Secondary | ICD-10-CM | POA: Diagnosis present

## 2022-12-13 DIAGNOSIS — M62838 Other muscle spasm: Secondary | ICD-10-CM | POA: Insufficient documentation

## 2022-12-13 DIAGNOSIS — R1084 Generalized abdominal pain: Secondary | ICD-10-CM | POA: Diagnosis not present

## 2022-12-13 LAB — CBC WITH DIFFERENTIAL/PLATELET
Abs Immature Granulocytes: 0.04 10*3/uL (ref 0.00–0.07)
Basophils Absolute: 0.1 10*3/uL (ref 0.0–0.1)
Basophils Relative: 1 %
Eosinophils Absolute: 0.2 10*3/uL (ref 0.0–0.5)
Eosinophils Relative: 2 %
HCT: 43.9 % (ref 39.0–52.0)
Hemoglobin: 15.5 g/dL (ref 13.0–17.0)
Immature Granulocytes: 0 %
Lymphocytes Relative: 22 %
Lymphs Abs: 2.3 10*3/uL (ref 0.7–4.0)
MCH: 30 pg (ref 26.0–34.0)
MCHC: 35.3 g/dL (ref 30.0–36.0)
MCV: 84.9 fL (ref 80.0–100.0)
Monocytes Absolute: 0.7 10*3/uL (ref 0.1–1.0)
Monocytes Relative: 7 %
Neutro Abs: 7.3 10*3/uL (ref 1.7–7.7)
Neutrophils Relative %: 68 %
Platelets: 124 10*3/uL — ABNORMAL LOW (ref 150–400)
RBC: 5.17 MIL/uL (ref 4.22–5.81)
RDW: 13.4 % (ref 11.5–15.5)
WBC: 10.6 10*3/uL — ABNORMAL HIGH (ref 4.0–10.5)
nRBC: 0 % (ref 0.0–0.2)

## 2022-12-13 LAB — COMPREHENSIVE METABOLIC PANEL
ALT: 27 U/L (ref 0–44)
AST: 32 U/L (ref 15–41)
Albumin: 4 g/dL (ref 3.5–5.0)
Alkaline Phosphatase: 77 U/L (ref 38–126)
Anion gap: 7 (ref 5–15)
BUN: 17 mg/dL (ref 6–20)
CO2: 27 mmol/L (ref 22–32)
Calcium: 8.7 mg/dL — ABNORMAL LOW (ref 8.9–10.3)
Chloride: 105 mmol/L (ref 98–111)
Creatinine, Ser: 1.01 mg/dL (ref 0.61–1.24)
GFR, Estimated: >60 mL/min (ref >60)
Glucose, Bld: 114 mg/dL — ABNORMAL HIGH (ref 70–99)
Potassium: 3.6 mmol/L (ref 3.5–5.1)
Sodium: 139 mmol/L (ref 135–145)
Total Bilirubin: 1.2 mg/dL — ABNORMAL HIGH (ref <1.2)
Total Protein: 6.9 g/dL (ref 6.5–8.1)

## 2022-12-13 LAB — LIPASE, BLOOD: Lipase: 48 U/L (ref 11–51)

## 2022-12-13 LAB — TROPONIN I (HIGH SENSITIVITY): Troponin I (High Sensitivity): 7 ng/L (ref <18)

## 2022-12-13 NOTE — ED Triage Notes (Signed)
Patient's visitor translating per patient request.  Patient c/o right leg pain, abdominal, and chest pain.  Patient denies n/v and shortness of breath.

## 2022-12-14 ENCOUNTER — Emergency Department: Payer: BC Managed Care – PPO

## 2022-12-14 ENCOUNTER — Emergency Department
Admission: EM | Admit: 2022-12-14 | Discharge: 2022-12-14 | Disposition: A | Discharge status: Home / Self Care | Payer: BC Managed Care – PPO | Attending: Emergency Medicine | Admitting: Emergency Medicine

## 2022-12-14 DIAGNOSIS — M62838 Other muscle spasm: Secondary | ICD-10-CM

## 2022-12-14 DIAGNOSIS — R1084 Generalized abdominal pain: Secondary | ICD-10-CM

## 2022-12-14 LAB — TROPONIN I (HIGH SENSITIVITY): Troponin I (High Sensitivity): 6 ng/L (ref <18)

## 2022-12-14 MED ORDER — KETOROLAC TROMETHAMINE 30 MG/ML IJ SOLN
30.0000 mg | Freq: Once | INTRAMUSCULAR | Status: AC
  Administered 2022-12-14: 30 mg via INTRAMUSCULAR
  Filled 2022-12-14: qty 1

## 2022-12-14 MED ORDER — METHOCARBAMOL 500 MG PO TABS: 500.0000 mg | ORAL_TABLET | Freq: Three times a day (TID) | ORAL | 0 refills | Status: AC | PRN

## 2022-12-14 NOTE — Discharge Instructions (Signed)
Please take Tylenol and ibuprofen/Advil for your pain.  It is safe to take them together, or to alternate them every few hours.  Take up to 1000mg  of Tylenol at a time, up to 4 times per day.  Do not take more than 4000 mg of Tylenol in 24 hours.  For ibuprofen, take 400-600 mg, 3 - 4 times per day.  Use Robaxin muscle relaxer as needed for more severe/breakthrough pain, up to 3 times per day. This medication can make some people sleepy, so do not use while driving, working or Designer, television/film set

## 2022-12-14 NOTE — ED Notes (Signed)
Pt to CT

## 2022-12-14 NOTE — ED Provider Notes (Signed)
[facility] Provider Note    Event Date/Time   First MD Initiated Contact with Patient 12/14/22 0059     (approximate)   History   Abdominal Pain and Chest Pain   HPI  Javier Rice is a 57 y.o. male who presents to the ED for evaluation of Abdominal Pain and Chest Pain   Review of PCP visit from April.  Patient presents with his daughter and she translates at their request.  I do offer our remote translator services.  Patient reports developing an atraumatic muscular spasm to his right hamstring earlier this evening, with pain spreading superiorly up into his groin, abdomen, chest and back.  He reports feeling better by the time I see him but still some residual lower abdominal discomfort.   Physical Exam   Triage Vital Signs: ED Triage Vitals [12/13/22 2009]  Encounter Vitals Group     BP (!) 140/85     Systolic BP Percentile      Diastolic BP Percentile      Pulse Rate 65     Resp 18     Temp 98.1 F (36.7 C)     Temp Source Oral     SpO2 100 %     Weight 190 lb (86.2 kg)     Height      Head Circumference      Peak Flow      Pain Score 5     Pain Loc      Pain Education      Exclude from Growth Chart     Most recent vital signs: Vitals:   12/14/22 0130 12/14/22 0300  BP: 133/86 126/79  Pulse: 69 68  Resp: 18 18  Temp:    SpO2: 97% 100%    General: Awake, no distress.  CV:  Good peripheral perfusion.  Resp:  Normal effort.  Abd:  No distention.  Mild suprapubic and LLQ tenderness without peritoneal features. MSK:  No deformity noted.  Neuro:  No focal deficits appreciated. Other:     ED Results / Procedures / Treatments   Labs (all labs ordered are listed, but only abnormal results are displayed) Labs Reviewed  CBC WITH DIFFERENTIAL/PLATELET - Abnormal; Notable for the following components:      Result Value   WBC 10.6 (*)    Platelets 124 (*)    All other components within normal limits  COMPREHENSIVE  METABOLIC PANEL - Abnormal; Notable for the following components:   Glucose, Bld 114 (*)    Calcium 8.7 (*)    Total Bilirubin 1.2 (*)    All other components within normal limits  LIPASE, BLOOD  URINALYSIS, ROUTINE W REFLEX MICROSCOPIC  TROPONIN I (HIGH SENSITIVITY)  TROPONIN I (HIGH SENSITIVITY)    EKG Sinus rhythm with a rate of 65 bpm.  Normal axis and intervals.  No clear signs of acute ischemia.  RADIOLOGY CT abdomen/pelvis interpreted by me without signs of acute otology.  Official radiology report(s): CT ABDOMEN PELVIS WO CONTRAST  Result Date: 12/14/2022 CLINICAL DATA:  Left lower quadrant pain EXAM: CT ABDOMEN AND PELVIS WITHOUT CONTRAST TECHNIQUE: Multidetector CT imaging of the abdomen and pelvis was performed following the standard protocol without IV contrast. RADIATION DOSE REDUCTION: This exam was performed according to the departmental dose-optimization program which includes automated exposure control, adjustment of the mA and/or kV according to patient size and/or use of iterative reconstruction technique. COMPARISON:  03/23/2019 FINDINGS: Lower chest: No acute abnormality. Hepatobiliary: No focal liver  abnormality is seen. No gallstones, gallbladder wall thickening, or biliary dilatation. Pancreas: Unremarkable. No pancreatic ductal dilatation or surrounding inflammatory changes. Spleen: Splenic lesions are again identified measuring up to 7.5 cm medially. This is slightly smaller than that seen on the prior exam. Given their long-term stability these are likely benign in etiology possibly representing hamartomas/hemangiomas. Adrenals/Urinary Tract: Adrenal glands are within normal limits. Kidneys are well visualized bilaterally. No renal calculi or obstructive changes are seen. The bladder is well distended. Stomach/Bowel: The appendix is within normal limits. No obstructive or inflammatory changes of the colon are noted. Small bowel and stomach appear within normal limits.  Vascular/Lymphatic: Aortic atherosclerosis. No enlarged abdominal or pelvic lymph nodes. Reproductive: Prostate is unremarkable. Other: No abdominal wall hernia or abnormality. No abdominopelvic ascites. Musculoskeletal: Stable mottled appearance in the pelvic bones is noted unchanged from the prior exam. IMPRESSION: Slight decrease in the splenic lesions when compare with prior exam and MRI is from earlier this year. Again these likely represent multiple hemangiomas or hamartomas. Stable chronic changes when compared with the prior study. No acute abnormality is noted to correspond with clinical history. Electronically Signed   By: Alcide Clever M.D.   On: 12/14/2022 02:16    PROCEDURES and INTERVENTIONS:  .1-3 Lead EKG Interpretation  Performed by: Delton Prairie, MD Authorized by: Delton Prairie, MD     Interpretation: normal     ECG rate:  64   ECG rate assessment: normal     Rhythm: sinus rhythm     Ectopy: none     Conduction: normal     Medications  ketorolac (TORADOL) 30 MG/ML injection 30 mg (30 mg Intramuscular Given 12/14/22 0130)     IMPRESSION / MDM / ASSESSMENT AND PLAN / ED COURSE  I reviewed the triage vital signs and the nursing notes.  Differential diagnosis includes, but is not limited to, muscular spasm, pancreatitis, ACS, DVT, trauma, rhabdo  {Patient presents with symptoms of an acute illness or injury that is potentially life-threatening.  Patient presents with spasming pain that is likely muscular in etiology and suitable for outpatient management.  Resolution with Toradol.  Benign workup with nonischemic EKG, negative troponin.  Normal lipase, LFTs and CBC.  CT due to localized abdominal tenderness without acute features.  Suitable for outpatient management  Clinical Course as of 12/14/22 0550  Sat Dec 14, 2022  0255 Reassessed.  Pain is resolved with Toradol.  Discussed reassuring CT scan, possible etiologies of symptoms and return precautions [DS]    Clinical  Course User Index [DS] Delton Prairie, MD     FINAL CLINICAL IMPRESSION(S) / ED DIAGNOSES   Final diagnoses:  Generalized abdominal pain  Muscle spasm     Rx / DC Orders   ED Discharge Orders          Ordered    methocarbamol (ROBAXIN) 500 MG tablet  Every 8 hours PRN        12/14/22 0256             Note:  This document was prepared using Dragon voice recognition software and may include unintentional dictation errors.   Delton Prairie, MD 12/14/22 [phone]

## 2023-03-05 ENCOUNTER — Other Ambulatory Visit: Payer: Self-pay

## 2023-03-05 ENCOUNTER — Emergency Department: Payer: BC Managed Care – PPO

## 2023-03-05 ENCOUNTER — Emergency Department
Admission: EM | Admit: 2023-03-05 | Discharge: 2023-03-05 | Disposition: A | Discharge status: Home / Self Care | Payer: BC Managed Care – PPO | Attending: Emergency Medicine | Admitting: Emergency Medicine

## 2023-03-05 DIAGNOSIS — R0789 Other chest pain: Secondary | ICD-10-CM | POA: Diagnosis not present

## 2023-03-05 DIAGNOSIS — R002 Palpitations: Secondary | ICD-10-CM | POA: Diagnosis not present

## 2023-03-05 DIAGNOSIS — R079 Chest pain, unspecified: Secondary | ICD-10-CM | POA: Diagnosis present

## 2023-03-05 LAB — CBC
HCT: 43.6 % (ref 39.0–52.0)
Hemoglobin: 15 g/dL (ref 13.0–17.0)
MCH: 29.6 pg (ref 26.0–34.0)
MCHC: 34.4 g/dL (ref 30.0–36.0)
MCV: 86 fL (ref 80.0–100.0)
Platelets: 119 10*3/uL — ABNORMAL LOW (ref 150–400)
RBC: 5.07 MIL/uL (ref 4.22–5.81)
RDW: 13.8 % (ref 11.5–15.5)
WBC: 8.8 10*3/uL (ref 4.0–10.5)
nRBC: 0 % (ref 0.0–0.2)

## 2023-03-05 LAB — BASIC METABOLIC PANEL
Anion gap: 7 (ref 5–15)
BUN: 12 mg/dL (ref 6–20)
CO2: 24 mmol/L (ref 22–32)
Calcium: 9.1 mg/dL (ref 8.9–10.3)
Chloride: 107 mmol/L (ref 98–111)
Creatinine, Ser: 0.73 mg/dL (ref 0.61–1.24)
GFR, Estimated: >60 mL/min (ref >60)
Glucose, Bld: 87 mg/dL (ref 70–99)
Potassium: 4 mmol/L (ref 3.5–5.1)
Sodium: 138 mmol/L (ref 135–145)

## 2023-03-05 LAB — TROPONIN I (HIGH SENSITIVITY)
Troponin I (High Sensitivity): 4 ng/L (ref <18)
Troponin I (High Sensitivity): 5 ng/L (ref <18)

## 2023-03-05 NOTE — ED Triage Notes (Signed)
 Pt arrives from [facility] with chest pain of a rapid heart rate and pain in the left leg. Pt sts that this has been going on for the last several days.

## 2023-03-05 NOTE — ED Provider Notes (Signed)
 [facility] Provider Note    Event Date/Time   First MD Initiated Contact with Patient 03/05/23 1821     (approximate)   History   Chest Pain   HPI  Javier Rice is a 58 y.o. male who presents to the ED for evaluation of Chest Pain   Patient presents for evaluation of chronic, years, of left groin pain intermittently, and palpitations over the past 2 days.  Reports that sometimes feels like a sharp pain in his chest but is mostly concerned about palpitations and feeling a beating sensation.  No syncope or falls.  No shortness of breath.   Physical Exam   Triage Vital Signs: ED Triage Vitals  Encounter Vitals Group     BP 03/05/23 1600 (!) 131/90     Systolic BP Percentile --      Diastolic BP Percentile --      Pulse Rate 03/05/23 1600 (!) 53     Resp 03/05/23 1600 16     Temp 03/05/23 1600 98.1 F (36.7 C)     Temp Source 03/05/23 1600 Oral     SpO2 03/05/23 1600 100 %     Weight 03/05/23 1558 190 lb (86.2 kg)     Height 03/05/23 1558 5\' 5"  (1.651 m)     Head Circumference --      Peak Flow --      Pain Score 03/05/23 1558 6     Pain Loc --      Pain Education --      Exclude from Growth Chart --     Most recent vital signs: Vitals:   03/05/23 1600  BP: (!) 131/90  Pulse: (!) 53  Resp: 16  Temp: 98.1 F (36.7 C)  SpO2: 100%    General: Awake, no distress.  CV:  Good peripheral perfusion.  RRR without appreciable murmur Resp:  Normal effort.  Abd:  No distention.  MSK:  No deformity noted.  Neuro:  No focal deficits appreciated. Other:     ED Results / Procedures / Treatments   Labs (all labs ordered are listed, but only abnormal results are displayed) Labs Reviewed  CBC - Abnormal; Notable for the following components:      Result Value   Platelets 119 (*)    All other components within normal limits  BASIC METABOLIC PANEL  TROPONIN I (HIGH SENSITIVITY)  TROPONIN I (HIGH SENSITIVITY)    EKG Sinus rhythm with  a rate of 53 bpm.  Normal axis and intervals.  No acute signs of acute ischemia. Similar morphology as comparison from 3 months ago  RADIOLOGY CXR interpreted by me without evidence of acute cardiopulmonary pathology.  Official radiology report(s): DG Chest 2 View Result Date: 03/05/2023 CLINICAL DATA:  Chest pain. EXAM: CHEST - 2 VIEW COMPARISON:  03/23/2019. FINDINGS: Bilateral lung fields are clear. Bilateral costophrenic angles are clear. Normal cardio-mediastinal silhouette. No acute osseous abnormalities. The soft tissues are within normal limits. IMPRESSION: No active cardiopulmonary disease. Electronically Signed   By: Jules Schick M.D.   On: 03/05/2023 16:59    PROCEDURES and INTERVENTIONS:  .1-3 Lead EKG Interpretation  Performed by: Delton Prairie, MD Authorized by: Delton Prairie, MD     Interpretation: normal     ECG rate:  60   ECG rate assessment: normal     Rhythm: sinus rhythm     Ectopy: none     Conduction: normal     Medications - No data to display  IMPRESSION / MDM / ASSESSMENT AND PLAN / ED COURSE  I reviewed the triage vital signs and the nursing notes.  Differential diagnosis includes, but is not limited to, ACS, PTX, PNA, muscle strain/spasm, PE, dissection, anxiety, pleural effusion, electrolyte derangement  {Patient presents with symptoms of an acute illness or injury that is potentially life-threatening.  Patient presents with palpitations and chest discomfort with a benign workup and suitable for outpatient management.  Currently asymptomatic.  EKG and telemetry without dysrhythmia.  Normal CBC, metabolic panel and troponins.  Refer to PCP.  Discussed ED return precautions.      FINAL CLINICAL IMPRESSION(S) / ED DIAGNOSES   Final diagnoses:  Other chest pain  Palpitations     Rx / DC Orders   ED Discharge Orders          Ordered    Ambulatory Referral to Primary Care (Establish Care)        03/05/23 1943             Note:   This document was prepared using Dragon voice recognition software and may include unintentional dictation errors.   Delton Prairie, MD 03/05/23 [phone]

## 2023-03-05 NOTE — ED Triage Notes (Signed)
 First Nurse Note: Patient to ED from [facility] for rapid heart rate, LLQ pain, and left thigh pain. Last BM today. VS WNL

## 2023-04-16 ENCOUNTER — Inpatient Hospital Stay: Payer: BC Managed Care – PPO | Admitting: Oncology

## 2023-04-16 ENCOUNTER — Inpatient Hospital Stay: Payer: BC Managed Care – PPO

## 2023-04-16 ENCOUNTER — Encounter: Payer: Self-pay | Admitting: Oncology
